# Patient Record
Sex: Male | Born: 1995 | Race: Black or African American | Hispanic: No | Marital: Single | Smoking: Current every day smoker
Health system: Southern US, Community
[De-identification: ages and names within clinical notes are randomized; demographics above are authoritative.]

## PROBLEM LIST (undated history)

## (undated) DIAGNOSIS — A539 Syphilis, unspecified: Secondary | ICD-10-CM

## (undated) HISTORY — PX: PATENT DUCTUS ARTERIOUS REPAIR: SHX269

---
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
## 2019-06-22 ENCOUNTER — Encounter (HOSPITAL_COMMUNITY): Payer: Self-pay | Admitting: Emergency Medicine

## 2019-06-22 ENCOUNTER — Other Ambulatory Visit: Payer: Self-pay

## 2019-06-22 DIAGNOSIS — F17210 Nicotine dependence, cigarettes, uncomplicated: Secondary | ICD-10-CM | POA: Insufficient documentation

## 2019-06-22 DIAGNOSIS — N341 Nonspecific urethritis: Secondary | ICD-10-CM | POA: Insufficient documentation

## 2019-06-22 DIAGNOSIS — Z7982 Long term (current) use of aspirin: Secondary | ICD-10-CM | POA: Insufficient documentation

## 2019-06-22 NOTE — ED Triage Notes (Signed)
Pt c/o swelling around head of penis and white discharge x 2 days.

## 2019-06-23 ENCOUNTER — Emergency Department (HOSPITAL_COMMUNITY)
Admission: EM | Admit: 2019-06-23 | Discharge: 2019-06-23 | Disposition: A | Discharge status: Home / Self Care | Payer: Self-pay | Attending: Emergency Medicine | Admitting: Emergency Medicine

## 2019-06-23 DIAGNOSIS — N342 Other urethritis: Secondary | ICD-10-CM

## 2019-06-23 LAB — URINALYSIS, ROUTINE W REFLEX MICROSCOPIC
Bacteria, UA: NONE SEEN
Bilirubin Urine: NEGATIVE
Glucose, UA: NEGATIVE mg/dL
Ketones, ur: 5 mg/dL — AB
Nitrite: NEGATIVE
Protein, ur: NEGATIVE mg/dL
Specific Gravity, Urine: 1.023 (ref 1.005–1.030)
WBC, UA: >50 WBC/hpf — ABNORMAL HIGH (ref 0–5)
pH: 6 (ref 5.0–8.0)

## 2019-06-23 LAB — RAPID HIV SCREEN (HIV 1/2 AB+AG)
HIV 1/2 Antibodies: NONREACTIVE
HIV-1 P24 Antigen - HIV24: NONREACTIVE

## 2019-06-23 MED ORDER — STERILE WATER FOR INJECTION IJ SOLN
INTRAMUSCULAR | Status: AC
  Administered 2019-06-23: 01:00:00 0.9 mL
  Filled 2019-06-23: qty 10

## 2019-06-23 MED ORDER — CEFTRIAXONE SODIUM 250 MG IJ SOLR
250.0000 mg | Freq: Once | INTRAMUSCULAR | Status: AC
  Administered 2019-06-23: 01:00:00 250 mg via INTRAMUSCULAR
  Filled 2019-06-23: qty 250

## 2019-06-23 MED ORDER — AZITHROMYCIN 250 MG PO TABS
1000.0000 mg | ORAL_TABLET | Freq: Once | ORAL | Status: AC
  Administered 2019-06-23: 1000 mg via ORAL
  Filled 2019-06-23: qty 4

## 2019-06-23 NOTE — ED Provider Notes (Signed)
[facility] EMERGENCY DEPARTMENT Provider Note   CSN: [id number] Arrival date & time: 06/22/19  2251     History   Chief Complaint Chief Complaint  Patient presents with  . Groin Swelling    HPI Timothy Harris is a 23 y.o. male.     Patient is a 23 year old male who presents to the emergency department with a complaint of swelling of the penis and discharge.  The patient states that this discharge started about 2 days ago.  He has had the swelling of the area behind the head of the penis for a little bit longer than that.  He says he has noticed a white discharge.  He has pain when he urinates.  There is been no blood involved.  Patient has not had any fever.  He has not noticed any unusual rash in his groin.  He has noticed some of the glands in his groin to be swollen.  He presents to the emergency department for evaluation of this issue.  The history is provided by the patient.    History reviewed. No pertinent past medical history.  There are no active problems to display for this patient.   Past Surgical History:  Procedure Laterality Date  . PATENT DUCTUS ARTERIOUS REPAIR          Home Medications    Prior to Admission medications   Medication Sig Start Date End Date Taking? Authorizing Provider  aspirin 81 MG chewable tablet Chew 81 mg by mouth every 8 (eight) hours as needed. For pain     [provider]  omeprazole (PRILOSEC) 20 MG capsule Take 1 po BID x 2 weeks then once a day 08/21/12   Rolland Porter, MD    Family History No family history on file.  Social History Social History   Tobacco Use  . Smoking status: Current Every Day Smoker  . Smokeless tobacco: Never Used  Substance Use Topics  . Alcohol use: Yes  . Drug use: Yes    Types: Marijuana     Allergies   Patient has no known allergies.   Review of Systems Review of Systems  Constitutional: Negative for activity change, appetite change and fever.  HENT: Negative for  congestion, ear discharge, ear pain, facial swelling, nosebleeds, rhinorrhea, sneezing and tinnitus.   Eyes: Negative for photophobia, pain and discharge.  Respiratory: Negative for cough, choking, shortness of breath and wheezing.   Cardiovascular: Negative for chest pain, palpitations and leg swelling.  Gastrointestinal: Negative for abdominal pain, blood in stool, constipation, diarrhea, nausea and vomiting.  Genitourinary: Positive for discharge and penile swelling. Negative for difficulty urinating, dysuria, flank pain, frequency, hematuria, scrotal swelling and testicular pain.  Musculoskeletal: Negative for back pain, gait problem, myalgias and neck pain.  Skin: Negative for color change, rash and wound.  Neurological: Negative for dizziness, seizures, syncope, facial asymmetry, speech difficulty, weakness and numbness.  Hematological: Negative for adenopathy. Does not bruise/bleed easily.  Psychiatric/Behavioral: Negative for agitation, confusion, hallucinations, self-injury and suicidal ideas. The patient is not nervous/anxious.      Physical Exam Updated Vital Signs BP (!) 148/39   Pulse 84   Temp 98.9 F (37.2 C)   Resp 17   Ht 5\' 7"  (1.702 m)   Wt 72.6 kg   SpO2 100%   BMI 25.06 kg/m   Physical Exam Vitals signs and nursing note reviewed.  Constitutional:      Appearance: He is well-developed. He is not toxic-appearing.  HENT:  Head: Normocephalic.     Right Ear: Tympanic membrane and external ear normal.     Left Ear: Tympanic membrane and external ear normal.  Eyes:     General: Lids are normal.     Pupils: Pupils are equal, round, and reactive to light.  Neck:     Musculoskeletal: Normal range of motion and neck supple.     Vascular: No carotid bruit.  Cardiovascular:     Rate and Rhythm: Normal rate and regular rhythm.     Pulses: Normal pulses.     Heart sounds: Normal heart sounds.  Pulmonary:     Effort: No respiratory distress.     Breath sounds:  Normal breath sounds.  Abdominal:     General: Bowel sounds are normal.     Palpations: Abdomen is soft.     Tenderness: There is no abdominal tenderness. There is no guarding.  Genitourinary:    Comments: There are palpable nodes in the right and left inguinal area.  The patient is circumcised.  There is a white to yellowish discharge from the urethra.  There is minimal swelling of the glans of the penis.  There is swelling just behind the glans of the penis.  This area is nontender, and soft.  There is no rash on the glans of the penis.  There is no testicular tenderness or swelling.  There is no evidence for hernia. Musculoskeletal: Normal range of motion.  Lymphadenopathy:     Head:     Right side of head: No submandibular adenopathy.     Left side of head: No submandibular adenopathy.     Cervical: No cervical adenopathy.  Skin:    General: Skin is warm and dry.  Neurological:     Mental Status: He is alert and oriented to person, place, and time.     Cranial Nerves: No cranial nerve deficit.     Sensory: No sensory deficit.  Psychiatric:        Speech: Speech normal.      ED Treatments / Results  Labs (all labs ordered are listed, but only abnormal results are displayed) Labs Reviewed - No data to display  EKG None  Radiology No results found.  Procedures Procedures (including critical care time)  Medications Ordered in ED Medications - No data to display   Initial Impression / Assessment and Plan / ED Course  I have reviewed the triage vital signs and the nursing notes.  Pertinent labs & imaging results that were available during my care of the patient were reviewed by me and considered in my medical decision making (see chart for details).          Final Clinical Impressions(s) / ED Diagnoses MDM  Vital signs are within normal limits.  Pulse oximetry is 100% on room air.  Patient has a urethral discharge.  He also has some swelling behind the head of  the penis.  He is not having difficulty with urinating with the exception of pain with urinating.  He has minimal swelling of the glands of the penis.  There is no discoloration of the head of the penis.  There is a white to yellowish urethral discharge present.  Patient treated with Rocephin and Zithromax.  GC chlamydia testing is pending.  RPR and HIV also pending. Patient seen with me by Dr. Clayborne Dana  I have asked the patient to apply gentle pressure to the swollen area behind the head of the penis to help hopefully decrease some of  the swelling.  I have asked him to see the urology specialist if this is not improving, or if he has difficulty passing his urine, or change in the coloration of the glans of the penis.  Patient is in agreement with this plan.   Final diagnoses:  Urethritis    ED Discharge Orders    None       Timothy Harris, Timothy Mitchell, PA-C 06/23/19 0139    Mesner, Barbara CowerJason, MD 06/23/19 [phone]

## 2019-06-23 NOTE — ED Provider Notes (Signed)
Medical screening examination/treatment/procedure(s) were conducted as a shared visit with non-physician practitioner(s) and myself.  I personally evaluated the patient during the encounter.  Here with swelling just proximal to glans penis. Soft, not restricting on exam. Glans not edematous or discolored. Has some dysuria, will eval for STD/UTI. Otherwise gentle/warm compresses, urology follow up if needed.         Vanessa Kampf, Corene Cornea, MD 06/23/19 [phone]

## 2019-06-23 NOTE — Discharge Instructions (Addendum)
Your examination reveals a urethritis.  You also have some swelling of the area behind the head of the penis.  You were treated in the emergency department with an intramuscular shot of Rocephin, and oral Zithromax.  Please refrain from all sexual activity over the next 7 days.  Please apply gentle pressure to the swelling in the head of the penis.  Please see Dr. Cherylann Parr if you should develop difficulty with passing your urine, or if there is discoloration of the head of your penis.  Return to the emergency department if there are any emergent changes in your condition, worsening of your symptoms, problems or concerns.

## 2019-06-24 LAB — RPR
RPR Ser Ql: REACTIVE — AB
RPR Titer: 1:32 {titer}

## 2019-06-24 LAB — URINE CULTURE: Culture: <10000 — AB

## 2019-06-25 LAB — T.PALLIDUM AB, TOTAL: T Pallidum Abs: REACTIVE — AB

## 2020-06-06 ENCOUNTER — Encounter (HOSPITAL_COMMUNITY): Payer: Self-pay | Admitting: Emergency Medicine

## 2020-06-06 ENCOUNTER — Other Ambulatory Visit: Payer: Self-pay

## 2020-06-06 ENCOUNTER — Emergency Department (HOSPITAL_COMMUNITY)
Admission: EM | Admit: 2020-06-06 | Discharge: 2020-06-06 | Disposition: A | Discharge status: Home / Self Care | Payer: Self-pay | Attending: Emergency Medicine | Admitting: Emergency Medicine

## 2020-06-06 DIAGNOSIS — N341 Nonspecific urethritis: Secondary | ICD-10-CM | POA: Insufficient documentation

## 2020-06-06 DIAGNOSIS — Z7982 Long term (current) use of aspirin: Secondary | ICD-10-CM | POA: Insufficient documentation

## 2020-06-06 DIAGNOSIS — N342 Other urethritis: Secondary | ICD-10-CM

## 2020-06-06 DIAGNOSIS — F17200 Nicotine dependence, unspecified, uncomplicated: Secondary | ICD-10-CM | POA: Insufficient documentation

## 2020-06-06 HISTORY — DX: Syphilis, unspecified: A53.9

## 2020-06-06 LAB — URINALYSIS, ROUTINE W REFLEX MICROSCOPIC
Bilirubin Urine: NEGATIVE
Glucose, UA: NEGATIVE mg/dL
Ketones, ur: NEGATIVE mg/dL
Nitrite: NEGATIVE
Protein, ur: 30 mg/dL — AB
Specific Gravity, Urine: 1.028 (ref 1.005–1.030)
pH: 6 (ref 5.0–8.0)

## 2020-06-06 MED ORDER — DOXYCYCLINE HYCLATE 100 MG PO TABS
100.0000 mg | ORAL_TABLET | Freq: Once | ORAL | Status: AC
  Administered 2020-06-06: 100 mg via ORAL
  Filled 2020-06-06: qty 1

## 2020-06-06 MED ORDER — CEFTRIAXONE SODIUM 500 MG IJ SOLR
500.0000 mg | Freq: Once | INTRAMUSCULAR | Status: AC
  Administered 2020-06-06: 500 mg via INTRAMUSCULAR
  Filled 2020-06-06: qty 500

## 2020-06-06 MED ORDER — DOXYCYCLINE HYCLATE 100 MG PO CAPS: 100.0000 mg | ORAL_CAPSULE | Freq: Two times a day (BID) | ORAL | 0 refills | Status: AC

## 2020-06-06 NOTE — ED Notes (Signed)
Report to Ginger, RN, [facility]

## 2020-06-06 NOTE — ED Triage Notes (Signed)
Pt here for c/o penile discharge that he noticed yesterday and has "gotten heavier today". Pt hx Syphilis.

## 2020-06-06 NOTE — ED Notes (Addendum)
Pt here for complaint of penile discharge   Worse today   Wears condoms "sometimes"  Multiple partners   Has appt with health department on [facility]  Also reports burning w urination

## 2020-06-06 NOTE — ED Provider Notes (Signed)
[facility] EMERGENCY DEPARTMENT Provider Note   CSN: [id number] Arrival date & time: 06/06/20  0057     History Chief Complaint  Patient presents with  . SEXUALLY TRANSMITTED DISEASE    Timothy Harris is a 24 y.o. male.  The history is provided by the patient.  Penile Discharge This is a new problem. The current episode started 12 to 24 hours ago. The problem occurs daily. The problem has been gradually worsening. Pertinent negatives include no abdominal pain. Exacerbated by: urination. Nothing relieves the symptoms.  pt reports penile discharge No penile lesions He does not always use condoms Previous h/o syphilis     Past Medical History:  Diagnosis Date  . Syphilis     There are no problems to display for this patient.   Past Surgical History:  Procedure Laterality Date  . PATENT DUCTUS ARTERIOUS REPAIR         History reviewed. No pertinent family history.  Social History   Tobacco Use  . Smoking status: Current Every Day Smoker  . Smokeless tobacco: Never Used  Vaping Use  . Vaping Use: Never used  Substance Use Topics  . Alcohol use: Yes  . Drug use: Yes    Types: Marijuana    Home Medications Prior to Admission medications   Medication Sig Start Date End Date Taking? Authorizing Provider  aspirin 81 MG chewable tablet Chew 81 mg by mouth every 8 (eight) hours as needed. For pain     [provider]  doxycycline (VIBRAMYCIN) 100 MG capsule Take 1 capsule (100 mg total) by mouth 2 (two) times daily. One po bid x 7 days 06/06/20   Zadie Rhine, MD  omeprazole (PRILOSEC) 20 MG capsule Take 1 po BID x 2 weeks then once a day 08/21/12   Devoria Albe, MD    Allergies    Patient has no known allergies.  Review of Systems   Review of Systems  Constitutional: Negative for fever.  Gastrointestinal: Negative for abdominal pain.  Genitourinary: Positive for discharge.    Physical Exam Updated Vital Signs BP (!) 147/68 (BP Location: Right  Arm)   Pulse 76   Temp 98.4 F (36.9 C) (Oral)   Resp 18   Ht 1.702 m (5\' 7" )   Wt 81.6 kg   SpO2 100%   BMI 28.19 kg/m   Physical Exam CONSTITUTIONAL: Well developed/well nourished HEAD: Normocephalic/atraumatic EYES: EOMI CV: S1/S2 noted, no murmurs/rubs/gallops noted LUNGS:  no apparent distress ABDOMEN: soft GU whitish penile discharge, no penile lesions, no scrotal tenderness/edema, nurse chaperone present NEURO: Pt is awake/alert/appropriate, moves all extremitiesx4.  No facial droop.   EXTREMITIES:  full ROM SKIN: warm, color normal PSYCH: no abnormalities of mood noted, alert and oriented to situation  ED Results / Procedures / Treatments   Labs (all labs ordered are listed, but only abnormal results are displayed) Labs Reviewed  URINALYSIS, ROUTINE W REFLEX MICROSCOPIC - Abnormal; Notable for the following components:      Result Value   APPearance CLOUDY (*)    Hgb urine dipstick SMALL (*)    Protein, ur 30 (*)    Leukocytes,Ua LARGE (*)    Bacteria, UA RARE (*)    All other components within normal limits  GC/CHLAMYDIA PROBE AMP ([place]) NOT AT [facility]    EKG None  Radiology No results found.  Procedures Procedures (including critical care time)  Medications Ordered in ED Medications  cefTRIAXone (ROCEPHIN) injection 500 mg (has no administration in time range)  doxycycline (VIBRA-TABS) tablet 100 mg (has no administration in time range)    ED Course  I have reviewed the triage vital signs and the nursing notes.  Pertinent labs  results that were available during my care of the patient were reviewed by me and considered in my medical decision making (see chart for details).    MDM Rules/Calculators/A&P                          Pt declines HIV/RPR testing, reports he has appt later this week for syphilis treatment  Final Clinical Impression(s) / ED Diagnoses Final diagnoses:  Urethritis    Rx / DC Orders ED Discharge Orders          Ordered    doxycycline (VIBRAMYCIN) 100 MG capsule  2 times daily        06/06/20 0249           Zadie Rhine, MD 06/06/20 [phone]

## 2020-06-06 NOTE — ED Notes (Signed)
Pt does not want labs drawn. States he has an appt at health dept this week to have labs drawn.

## 2020-06-06 NOTE — ED Notes (Signed)
Urine to lab

## 2020-06-07 LAB — GC/CHLAMYDIA PROBE AMP (~~LOC~~) NOT AT ARMC
Chlamydia: NEGATIVE
Comment: NEGATIVE
Comment: NORMAL
Neisseria Gonorrhea: POSITIVE — AB

## 2021-11-14 ENCOUNTER — Emergency Department (HOSPITAL_COMMUNITY): Payer: Self-pay

## 2021-11-14 ENCOUNTER — Encounter (HOSPITAL_COMMUNITY): Payer: Self-pay | Admitting: *Deleted

## 2021-11-14 ENCOUNTER — Emergency Department (HOSPITAL_COMMUNITY)
Admission: EM | Admit: 2021-11-14 | Discharge: 2021-11-14 | Disposition: A | Discharge status: Home / Self Care | Payer: Self-pay | Attending: Emergency Medicine | Admitting: Emergency Medicine

## 2021-11-14 ENCOUNTER — Other Ambulatory Visit: Payer: Self-pay

## 2021-11-14 DIAGNOSIS — Z20822 Contact with and (suspected) exposure to covid-19: Secondary | ICD-10-CM | POA: Insufficient documentation

## 2021-11-14 DIAGNOSIS — J069 Acute upper respiratory infection, unspecified: Secondary | ICD-10-CM | POA: Insufficient documentation

## 2021-11-14 LAB — RESP PANEL BY RT-PCR (FLU A&B, COVID) ARPGX2
Influenza A by PCR: NEGATIVE
Influenza B by PCR: NEGATIVE
SARS Coronavirus 2 by RT PCR: NEGATIVE

## 2021-11-14 MED ORDER — ALBUTEROL SULFATE HFA 108 (90 BASE) MCG/ACT IN AERS
2.0000 | INHALATION_SPRAY | Freq: Once | RESPIRATORY_TRACT | Status: AC
  Administered 2021-11-14: 2 via RESPIRATORY_TRACT
  Filled 2021-11-14: qty 6.7

## 2021-11-14 NOTE — ED Provider Notes (Signed)
?[facility] ?Provider Note ? ? ?CSN: [id number] ?Arrival date & time: 11/14/21  1434 ? ?  ? ?History ? ?Chief Complaint  ?Patient presents with  ? Cough  ? ? ?Timothy Harris is a 26 y.o. male. ? ?HPI ? ? Patient is a 26 year-old-male with a pertinent history of PDA repair in 2011 and current 2 pack year smoking history who presents to the ED with complaints of cough. Cough started about 2 days ago and has worsened since onset. Cough is dry, non-productive with associated sharp pleuritic chest pain at mid-chest. States he is concerned of his PDA device with the chest pain. Patient endorses one episode of post-tussive emesis with some blood yesterday. Has tried OTC cough remedies such as cough drops and honey with minimal relief. Had a "seasonal cold" about 1 week ago. Denies any recent sick contacts.  Patient Dors that he does have slight shortness of breath but this is only intermittent, no happens after he coughs and tries to ambulate.  He currently has no shortness of breath at this time, he states chest pain is only after coughing, has not this time.  No history of PEs or DVTs currently not on hormone therapy. ? ? ?Home Medications ?Prior to Admission medications   ?Medication Sig Start Date End Date Taking? Authorizing Provider  ?aspirin 81 MG chewable tablet Chew 81 mg by mouth every 8 (eight) hours as needed. For pain     [provider]  ?doxycycline (VIBRAMYCIN) 100 MG capsule Take 1 capsule (100 mg total) by mouth 2 (two) times daily. One po bid x 7 days 06/06/20   Zadie Rhine, MD  ?omeprazole (PRILOSEC) 20 MG capsule Take 1 po BID x 2 weeks then once a day 08/21/12   Devoria Albe, MD  ?   ? ?Allergies    ?Patient has no known allergies.   ? ?Review of Systems   ?Review of Systems  ?Constitutional:  Negative for chills and fever.  ?Respiratory:  Positive for cough and chest tightness. Negative for shortness of breath.   ?Cardiovascular:  Negative for chest pain.   ?Gastrointestinal:  Negative for abdominal pain, diarrhea, nausea and vomiting.  ?Neurological:  Negative for headaches.  ? ?Physical Exam ?Updated Vital Signs ?BP (!) 150/98 (BP Location: Right Arm)   Pulse 92   Temp 98.4 ?F (36.9 ?C) (Oral)   Resp 18   Ht 5\' 7"  (1.702 m)   Wt 93.9 kg   SpO2 99%   BMI 32.42 kg/m?  ?Physical Exam ?Vitals and nursing note reviewed.  ?Constitutional:   ?   General: He is not in acute distress. ?   Appearance: He is not ill-appearing.  ?HENT:  ?   Head: Normocephalic and atraumatic.  ?   Right Ear: Tympanic membrane, ear canal and external ear normal.  ?   Left Ear: Tympanic membrane, ear canal and external ear normal.  ?   Nose: No congestion.  ?   Mouth/Throat:  ?   Mouth: Mucous membranes are moist.  ?   Pharynx: Oropharynx is clear. No oropharyngeal exudate or posterior oropharyngeal erythema.  ?Eyes:  ?   Conjunctiva/sclera: Conjunctivae normal.  ?Cardiovascular:  ?   Rate and Rhythm: Normal rate and regular rhythm.  ?   Pulses: Normal pulses.  ?   Heart sounds: No murmur heard. ?  No friction rub. No gallop.  ?Pulmonary:  ?   Effort: No respiratory distress.  ?   Breath sounds: No wheezing, rhonchi  or rales.  ?   Comments: Patient is showing no signs of respiratory distress, he is nontachypneic nonhypoxic, he speaking full sentences, lung sounds were assessed he has slightly diminished lung sounds in the lower lobes bilaterally, there is no wheezing rales or rhonchi present. ?Abdominal:  ?   Palpations: Abdomen is soft.  ?   Tenderness: There is no abdominal tenderness. There is no right CVA tenderness or left CVA tenderness.  ?Musculoskeletal:  ?   Right lower leg: No edema.  ?   Left lower leg: No edema.  ?Skin: ?   General: Skin is warm and dry.  ?Neurological:  ?   Mental Status: He is alert.  ?Psychiatric:     ?   Mood and Affect: Mood normal.  ? ? ?ED Results / Procedures / Treatments   ?Labs ?(all labs ordered are listed, but only abnormal results are  displayed) ?Labs Reviewed  ?RESP PANEL BY RT-PCR (FLU A&B, COVID) [id number]  ? ? ?EKG ?EKG Interpretation ? ?Date/Time:  Tuesday November 14 2021 15:30:50 EDT ?Ventricular Rate:  77 ?PR Interval:  156 ?QRS Duration: 108 ?QT Interval:  386 ?QTC Calculation: 436 ?R Axis:   83 ?Text Interpretation: Normal sinus rhythm with sinus arrhythmia Normal ECG When compared with ECG of 21-Aug-2012 19:03, PREVIOUS ECG IS PRESENT Confirmed by Kommor, Madison ([id number]) on 11/14/2021 4:11:20 PM ? ?Radiology ?DG Chest 2 View ? ?Result Date: 11/14/2021 ?CLINICAL DATA:  A 26 year old male presents for evaluation of dry cough for 2 days. EXAM: CHEST - 2 VIEW COMPARISON:  August 21, 2012. FINDINGS: Trachea midline. Cardiomediastinal contours and hilar structures are normal. Lungs are clear. No sign of effusion. Added density projecting over the AP window likely related to prior surgical repair of patent ductus. On limited assessment there is no acute skeletal finding. IMPRESSION: No active cardiopulmonary disease. Stable appearance of added density in the mediastinum adjacent to the thoracic aorta perhaps related to prior patent ductus repair. Electronically Signed   By: Donzetta Kohut M.D.   On: 11/14/2021 15:34   ? ?Procedures ?Procedures  ? ? ?Medications Ordered in ED ?Medications  ?albuterol (VENTOLIN HFA) 108 (90 Base) MCG/ACT inhaler 2 puff (2 puffs Inhalation Given 11/14/21 1537)  ? ? ?ED Course/ Medical Decision Making/ A&P ?  ?                        ?Medical Decision Making ?Amount and/or Complexity of Data Reviewed ?Radiology: ordered. ? ?Risk ?Prescription drug management. ? ? ?This patient presents to the ED for concern of URI-like symptoms, this involves an extensive number of treatment options, and is a complaint that carries with it a high risk of complications and morbidity.  The differential diagnosis includes ACS, PE, pneumonia ? ? ? ?Additional history obtained: ? ?Additional history obtained from N/A ?External records from  outside source obtained and reviewed including N/A ? ? ?Co morbidities that complicate the patient evaluation ? ?PDA ? ?Social Determinants of Health: ? ?N/A ? ? ? ?Lab Tests: ? ?I Ordered, and personally interpreted labs.  The pertinent results include: Respiratory panel ? ? ?Imaging Studies ordered: ? ?I ordered imaging studies including chest x-ray ?I independently visualized and interpreted imaging which showed chest x-ray unremarkable ?I agree with the radiologist interpretation ? ? ?Cardiac Monitoring: ? ?The patient was maintained on a cardiac monitor.  I personally viewed and interpreted the cardiac monitored which showed an underlying rhythm of: EKG without signs of ischemia ? ? ?  Medicines ordered and prescription drug management: ? ?I ordered medication including albuterol for chest tightness ?I have reviewed the patients home medicines and have made adjustments as needed ? ?Critical Interventions: ? ?N/A ? ? ?Reevaluation: ? ?Presented with URI-like symptoms, exam consistent with likely viral infection, he had slightly diminished lung sounds during my exam will provide with bronchodilators and reassess. ? ? ?Consultations Obtained: ? ?N/A ? ? ?Test Considered: ? ?N/A ? ? ? ?Rule out ?Low suspicion for systemic infection as patient is nontoxic-appearing, vital signs reassuring, no obvious source infection noted on exam.  Low suspicion for pneumonia as lung sounds are clear bilaterally, x-ray did not reveal any acute findings.  I have low suspicion for PE as patient denies pleuritic chest pain, shortness of breath, patient is PERC.  Low suspicion for ACS as presentation atypical, chest pain is only after coughing, is reproducible, EKG without signs of ischemia more consistent with likely viral infection.  Low suspicion for strep throat as oropharynx was visualized, no erythema or exudates noted.  Low suspicion patient would need  hospitalized due to viral infection or Covid as vital signs reassuring,  patient is not in respiratory distress.  ? ? ? ? ?Dispostion and problem list ? ?After consideration of the diagnostic results and the patients response to treatment, I feel that the patent would benefit from discharge. ? ?URI-likely viral in natur

## 2021-11-14 NOTE — Discharge Instructions (Addendum)
Likely a viral infection, recommend over-the-counter pain medications like ibuprofen Tylenol for fever and pain control, nasal decongestions like Flonase and Zyrtec, Mucinex for cough.  If not eating recommend supplementing with Gatorade to help with electrolyte supplementation. ? ?Given you albuterol please use as needed for shortness of breath use 1 to 2 puffs every 4-6 hours. ? ?Follow-up PCP for further evaluation. ? ?Come back to the emergency department if you develop chest pain, shortness of breath, severe abdominal pain, uncontrolled nausea, vomiting, diarrhea. ? ?

## 2021-11-14 NOTE — ED Triage Notes (Signed)
Pt with cough dry mostly per pt x 2 days. Pt with hx of Patent ductus arterious repair in 2011.  ?

## 2021-11-20 ENCOUNTER — Encounter (HOSPITAL_COMMUNITY): Payer: Self-pay | Admitting: *Deleted

## 2021-11-20 ENCOUNTER — Emergency Department (HOSPITAL_COMMUNITY)
Admission: EM | Admit: 2021-11-20 | Discharge: 2021-11-20 | Disposition: A | Discharge status: Home / Self Care | Payer: Self-pay | Attending: Emergency Medicine | Admitting: Emergency Medicine

## 2021-11-20 DIAGNOSIS — Z7982 Long term (current) use of aspirin: Secondary | ICD-10-CM | POA: Insufficient documentation

## 2021-11-20 DIAGNOSIS — R300 Dysuria: Secondary | ICD-10-CM | POA: Insufficient documentation

## 2021-11-20 DIAGNOSIS — R350 Frequency of micturition: Secondary | ICD-10-CM | POA: Insufficient documentation

## 2021-11-20 LAB — URINALYSIS, ROUTINE W REFLEX MICROSCOPIC
Bacteria, UA: NONE SEEN
Bilirubin Urine: NEGATIVE
Glucose, UA: NEGATIVE mg/dL
Ketones, ur: NEGATIVE mg/dL
Leukocytes,Ua: NEGATIVE
Nitrite: NEGATIVE
Protein, ur: NEGATIVE mg/dL
Specific Gravity, Urine: 1.001 — ABNORMAL LOW (ref 1.005–1.030)
pH: 7 (ref 5.0–8.0)

## 2021-11-20 LAB — CBG MONITORING, ED: Glucose-Capillary: 125 mg/dL — ABNORMAL HIGH (ref 70–99)

## 2021-11-20 LAB — PREGNANCY, URINE: Preg Test, Ur: NEGATIVE

## 2021-11-20 MED ORDER — CEPHALEXIN 500 MG PO CAPS: 500.0000 mg | ORAL_CAPSULE | Freq: Four times a day (QID) | ORAL | 0 refills | Status: AC

## 2021-11-20 NOTE — ED Provider Notes (Signed)
?[facility] ?Provider Note ? ? ?CSN: [id number] ?Arrival date & time: 11/20/21  1516 ? ?  ? ?History ? ?Chief Complaint  ?Patient presents with  ? Urinary Frequency  ? ? ?Timothy Harris is a 26 y.o. male. ? ? ?Urinary Frequency ? ? ?Patient presents with urinary frequency 1 day.  There is some dysuria but no hematuria.  Denies any penile discharge or abdominal pain or flank pain.  No fevers at home.  No new sexual partners.  States yesterday he did a "cleanse" where he drank a large amounts of fluids and has been peeing all throughout the night.  Unsure if that might be contributing to his current presentation. ? ?Home Medications ?Prior to Admission medications   ?Medication Sig Start Date End Date Taking? Authorizing Provider  ?aspirin 81 MG chewable tablet Chew 81 mg by mouth every 8 (eight) hours as needed. For pain     [provider]  ?doxycycline (VIBRAMYCIN) 100 MG capsule Take 1 capsule (100 mg total) by mouth 2 (two) times daily. One po bid x 7 days 06/06/20   Zadie Rhine, MD  ?omeprazole (PRILOSEC) 20 MG capsule Take 1 po BID x 2 weeks then once a day 08/21/12   Devoria Albe, MD  ?   ? ?Allergies    ?Patient has no known allergies.   ? ?Review of Systems   ?Review of Systems  ?Genitourinary:  Positive for frequency.  ? ?Physical Exam ?Updated Vital Signs ?BP (!) 148/108 (BP Location: Right Arm)   Pulse 75   Temp 98.3 ?F (36.8 ?C) (Oral)   Resp 18   Ht 5\' 7"  (1.702 m)   Wt 91.6 kg   SpO2 100%   BMI 31.64 kg/m?  ?Physical Exam ?Vitals and nursing note reviewed. Exam conducted with a chaperone present.  ?Constitutional:   ?   Appearance: Normal appearance.  ?HENT:  ?   Head: Normocephalic and atraumatic.  ?Eyes:  ?   General: No scleral icterus.    ?   Right eye: No discharge.     ?   Left eye: No discharge.  ?   Extraocular Movements: Extraocular movements intact.  ?   Pupils: Pupils are equal, round, and reactive to light.  ?Cardiovascular:  ?   Rate and Rhythm: Normal  rate and regular rhythm.  ?   Pulses: Normal pulses.  ?   Heart sounds: Normal heart sounds. No murmur heard. ?  No friction rub. No gallop.  ?Pulmonary:  ?   Effort: Pulmonary effort is normal. No respiratory distress.  ?   Breath sounds: Normal breath sounds.  ?Abdominal:  ?   General: Abdomen is flat. Bowel sounds are normal. There is no distension.  ?   Palpations: Abdomen is soft.  ?   Tenderness: There is no abdominal tenderness.  ?Skin: ?   General: Skin is warm and dry.  ?   Coloration: Skin is not jaundiced.  ?Neurological:  ?   Mental Status: He is alert. Mental status is at baseline.  ?   Coordination: Coordination normal.  ? ? ?ED Results / Procedures / Treatments   ?Labs ?(all labs ordered are listed, but only abnormal results are displayed) ?Labs Reviewed  ?URINALYSIS, ROUTINE W REFLEX MICROSCOPIC - Abnormal; Notable for the following components:  ?    Result Value  ? Specific Gravity, Urine 1.001 (*)   ? Hgb urine dipstick SMALL (*)   ? All other components within normal limits  ?CBG MONITORING,  ED - Abnormal; Notable for the following components:  ? Glucose-Capillary 125 (*)   ? All other components within normal limits  ?URINE CULTURE  ?PREGNANCY, URINE  ?GC/CHLAMYDIA PROBE AMP ([place]) NOT AT [facility]  ? ? ?EKG ?None ? ?Radiology ?No results found. ? ?Procedures ?Procedures  ? ? ?Medications Ordered in ED ?Medications - No data to display ? ?ED Course/ Medical Decision Making/ A&P ?  ?                        ?Medical Decision Making ?Amount and/or Complexity of Data Reviewed ?Labs: ordered. ? ? ?This patient presents to the ED for concern of dysuria this involves an extensive number of treatment options, and is a complaint that carries with it a high risk of complications and morbidity.  The differential diagnosis includes but is not limited to undiagnosed diabetes, UTI, urethritis, STD ? ? ?Additional history obtained:  ? ?Reviewed record, patient has history of frequent visits here with  urethritis, historically has penile discharge and that is not the case today. ?  ?Lab Tests: ? ?I ordered, viewed, and personally interpreted labs.  The pertinent results include:   ?-Erroneously ordered pregnancy urine. ?- Urine cultures pending.  GC chlamydia pending. ?-UA with trace hematuria but no leukocyturia ?-CBG slight elevation of sugar at 125 but not significantly hyperglycemic. ? ? ?Problems addressed / ED Course: ?26 year old with increased urinary frequency after drinking plenty a large amount of fluids yesterday.  UA shows some trace hematuria but there is no obvious signs of infection.  Culture is pending.   ? ?He is not having any symptoms that are suggestive of an STD, possible he has a UTI that was missed in UA.  I think more likely he is over hydrating and that is causing him to need to pee more often.  Patient agreeable to wait and see 3 days for the urine culture.  He will check on MyChart and if he does have a UTI he will pick up the antibiotic. ?  ?Social Determinants of Health: ? ?  ?Disposition: ? ? ?After consideration of the diagnostic results and the patients response to treatment, I feel that the patent would benefit from D/C. ? ? ? ? ? ? ? ? ?Final Clinical Impression(s) / ED Diagnoses ?Final diagnoses:  ?None  ? ? ?Rx / DC Orders ?ED Discharge Orders   ? ? None  ? ?  ? ? ?  ?Theron Arista, PA-C ?11/20/21 1756 ? ?  ?Mancel Bale, MD ?11/21/21 0015 ? ?

## 2021-11-20 NOTE — ED Triage Notes (Signed)
Urinary frequency

## 2021-11-20 NOTE — Discharge Instructions (Addendum)
I suspect the increased urination is probably from drinking too much fluid.  Check MyChart for the results of the culture.  If you grow any bacteria that would suggest a UTI you can pick up the Keflex on Friday next week.  Return for new or worsening symptoms. ?

## 2021-11-21 LAB — GC/CHLAMYDIA PROBE AMP (~~LOC~~) NOT AT ARMC
Chlamydia: NEGATIVE
Comment: NEGATIVE
Comment: NORMAL
Neisseria Gonorrhea: NEGATIVE

## 2021-11-22 LAB — URINE CULTURE: Culture: <10000 — AB

## 2022-01-23 ENCOUNTER — Other Ambulatory Visit: Payer: Self-pay

## 2022-01-23 ENCOUNTER — Emergency Department (HOSPITAL_COMMUNITY): Payer: Self-pay

## 2022-01-23 ENCOUNTER — Encounter (HOSPITAL_COMMUNITY): Payer: Self-pay | Admitting: Emergency Medicine

## 2022-01-23 ENCOUNTER — Emergency Department (HOSPITAL_COMMUNITY)
Admission: EM | Admit: 2022-01-23 | Discharge: 2022-01-23 | Disposition: A | Discharge status: Home / Self Care | Payer: Self-pay | Attending: Emergency Medicine | Admitting: Emergency Medicine

## 2022-01-23 DIAGNOSIS — Z7982 Long term (current) use of aspirin: Secondary | ICD-10-CM | POA: Insufficient documentation

## 2022-01-23 DIAGNOSIS — J9801 Acute bronchospasm: Secondary | ICD-10-CM

## 2022-01-23 DIAGNOSIS — J45909 Unspecified asthma, uncomplicated: Secondary | ICD-10-CM | POA: Insufficient documentation

## 2022-01-23 MED ORDER — ALBUTEROL SULFATE HFA 108 (90 BASE) MCG/ACT IN AERS
2.0000 | INHALATION_SPRAY | Freq: Once | RESPIRATORY_TRACT | Status: AC
  Administered 2022-01-23: 2 via RESPIRATORY_TRACT
  Filled 2022-01-23: qty 6.7

## 2022-01-23 MED ORDER — PREDNISONE 20 MG PO TABS
40.0000 mg | ORAL_TABLET | Freq: Once | ORAL | Status: AC
  Administered 2022-01-23: 40 mg via ORAL
  Filled 2022-01-23: qty 2

## 2022-01-23 MED ORDER — PREDNISONE 20 MG PO TABS: 40.0000 mg | ORAL_TABLET | Freq: Every day | ORAL | 0 refills | Status: AC

## 2022-01-23 MED ORDER — ALBUTEROL SULFATE HFA 108 (90 BASE) MCG/ACT IN AERS
1.0000 | INHALATION_SPRAY | Freq: Four times a day (QID) | RESPIRATORY_TRACT | 0 refills | Status: AC | PRN
Start: 2022-01-23 — End: ?

## 2022-01-23 NOTE — ED Notes (Signed)
Pt ambulated down hallway to end and back to room approximately 200 ft without difficulty or SOB.  RA sats 98-99% maintained during entire ambulation.

## 2022-01-23 NOTE — ED Triage Notes (Signed)
Pt having URI symptoms with SOB since yesterday, out of inhaler.

## 2022-01-23 NOTE — Discharge Instructions (Signed)
Use 1 to 2 puffs of the albuterol inhaler every 4-6 hours as needed.  Take the prednisone starting tomorrow as directed until finished.  Follow-up with your primary care provider for recheck return to the emergency department for any new or worsening symptoms

## 2022-01-23 NOTE — ED Provider Notes (Signed)
[facility] Provider Note   CSN: [id number] Arrival date & time: 01/23/22  1525     History  Chief Complaint  Patient presents with   URI    Timothy Harris is a 26 y.o. male.   URI Presenting symptoms: cough   Presenting symptoms: no congestion, no fever and no sore throat   Associated symptoms: wheezing   Associated symptoms: no arthralgias, no headaches and no myalgias       Timothy Harris is a 26 y.o. male with reported history of asthma, who presents to the Emergency Department complaining of cough, chest congestion and shortness of breath.  Symptoms present x 1-2 days.  Ran out of his inhaler 2 days ago.  Symptoms worse with exertion.  Cough reported as occasional and he has recently began exercising which he believes has exacerbated his asthma.  He denies fever, chills, chest pain, sore throat.  No hx of PE/DVT.    Home Medications Prior to Admission medications   Medication Sig Start Date End Date Taking? Authorizing Provider  aspirin 81 MG chewable tablet Chew 81 mg by mouth every 8 (eight) hours as needed. For pain     [provider]  cephALEXin (KEFLEX) 500 MG capsule Take 1 capsule (500 mg total) by mouth 4 (four) times daily. 11/24/21   Sherrill Raring, PA-C  doxycycline (VIBRAMYCIN) 100 MG capsule Take 1 capsule (100 mg total) by mouth 2 (two) times daily. One po bid x 7 days 06/06/20   Ripley Fraise, MD  omeprazole (PRILOSEC) 20 MG capsule Take 1 po BID x 2 weeks then once a day 08/21/12   Rolland Porter, MD      Allergies    Patient has no known allergies.    Review of Systems   Review of Systems  Constitutional:  Negative for chills and fever.  HENT:  Negative for congestion, sore throat and trouble swallowing.   Respiratory:  Positive for cough, choking, shortness of breath and wheezing. Negative for chest tightness.   Cardiovascular:  Negative for chest pain and leg swelling.  Gastrointestinal:  Negative for abdominal pain, nausea and  vomiting.  Musculoskeletal:  Negative for arthralgias and myalgias.  Skin:  Negative for rash and wound.  Neurological:  Negative for dizziness, syncope, weakness, numbness and headaches.   Physical Exam Updated Vital Signs BP (!) 148/91 (BP Location: Right Arm)   Pulse 73   Temp 98.7 F (37.1 C) (Oral)   Resp 19   Ht 5\' 7"  (1.702 m)   Wt 91.6 kg   SpO2 100%   BMI 31.63 kg/m  Physical Exam Vitals and nursing note reviewed.  Constitutional:      Appearance: Normal appearance. He is not ill-appearing or toxic-appearing.  HENT:     Mouth/Throat:     Mouth: Mucous membranes are moist.     Pharynx: Oropharynx is clear.  Cardiovascular:     Rate and Rhythm: Normal rate and regular rhythm.     Pulses: Normal pulses.  Pulmonary:     Effort: Pulmonary effort is normal.     Comments: Slightly diminished lung sounds bilaterally.  Expiratory wheeze heard at right lung base.  No increased work of breathing on exam Musculoskeletal:        General: Normal range of motion.  Skin:    General: Skin is warm.     Capillary Refill: Capillary refill takes less than 2 seconds.  Neurological:     General: No focal deficit present.  Mental Status: He is alert.     Sensory: No sensory deficit.     Motor: No weakness.    ED Results / Procedures / Treatments   Labs (all labs ordered are listed, but only abnormal results are displayed) Labs Reviewed - No data to display  EKG None  Radiology DG Chest 2 View  Result Date: 01/23/2022 CLINICAL DATA:  SOB EXAM: CHEST - 2 VIEW COMPARISON:  Chest radiographs November 14, 2021. FINDINGS: Similar cardiomediastinal silhouette. Similar small densities in the region of the AP window, probably related to prior repair of a patent ductus. No consolidation. No visible pleural effusions or pneumothorax. No evidence of acute osseous abnormality. IMPRESSION: No evidence of acute cardiopulmonary disease. Electronically Signed   By: Margaretha Sheffield M.D.   On:  01/23/2022 16:24    Procedures Procedures    Medications Ordered in ED Medications  predniSONE (DELTASONE) tablet 40 mg (has no administration in time range)  albuterol (VENTOLIN HFA) 108 (90 Base) MCG/ACT inhaler 2 puff (has no administration in time range)    ED Course/ Medical Decision Making/ A&P                           Medical Decision Making WithPatient here for cough and shortness of breath.  Symptoms present x2 days.  Reports history of asthma although I do not find this in his medical record.  Has been out of his albuterol inhaler.  No reported fever or chills.  No history of DVT or PE.  On exam, patient well-appearing nontoxic.  Vital signs reassuring.  No tachycardia tachypnea or hypoxia.  He does have some diminished lung sounds bilaterally with expiratory wheeze heard at right lung base.  No increased work of breathing on my exam.  Symptoms likely related to asthma exacerbation versus URI.  ACS and PE also considered but felt less likely.  He is PERC negative and not having chest pain  Amount and/or Complexity of Data Reviewed Radiology: ordered.    Details: Chest x-ray without acute cardiopulmonary process Discussion of management or test interpretation with external provider(s): On recheck, lung sounds improved after albuterol.  He is ambulatory in the department without hypoxia. Feel that he is appropriate for discharge home, albuterol MDI dispensed for home use and prescription written for steroid.  He will follow-up with PCP.  Return precautions were discussed.  Risk Prescription drug management.           Final Clinical Impression(s) / ED Diagnoses Final diagnoses:  Bronchospasm    Rx / DC Orders ED Discharge Orders     None         Bufford Lope 01/23/22 1739    Luna Fuse, MD 02/05/22 [phone]

## 2022-03-06 DIAGNOSIS — R519 Headache, unspecified: Secondary | ICD-10-CM | POA: Insufficient documentation

## 2022-03-06 DIAGNOSIS — R0602 Shortness of breath: Secondary | ICD-10-CM | POA: Insufficient documentation

## 2022-03-06 DIAGNOSIS — Z20822 Contact with and (suspected) exposure to covid-19: Secondary | ICD-10-CM | POA: Insufficient documentation

## 2022-03-07 ENCOUNTER — Other Ambulatory Visit: Payer: Self-pay

## 2022-03-07 ENCOUNTER — Emergency Department (HOSPITAL_COMMUNITY)
Admission: EM | Admit: 2022-03-07 | Discharge: 2022-03-07 | Disposition: A | Discharge status: Home / Self Care | Payer: Self-pay | Attending: Emergency Medicine | Admitting: Emergency Medicine

## 2022-03-07 ENCOUNTER — Encounter (HOSPITAL_COMMUNITY): Payer: Self-pay | Admitting: Emergency Medicine

## 2022-03-07 DIAGNOSIS — R519 Headache, unspecified: Secondary | ICD-10-CM

## 2022-03-07 LAB — RESP PANEL BY RT-PCR (FLU A&B, COVID) ARPGX2
Influenza A by PCR: NEGATIVE
Influenza B by PCR: NEGATIVE
SARS Coronavirus 2 by RT PCR: NEGATIVE

## 2022-03-07 NOTE — ED Notes (Signed)
ED Provider at bedside. 

## 2022-03-07 NOTE — ED Triage Notes (Signed)
Pt c/o headache all day and states he is sob at times. Pt states he needs covid test to return to work.

## 2022-03-07 NOTE — ED Provider Notes (Signed)
[facility] EMERGENCY DEPARTMENT Provider Note   CSN: [id number] Arrival date & time: 03/06/22  2331     History  Chief Complaint  Patient presents with   Headache    Fitzgerald Dunne is a 26 y.o. male.  26 year old male who presents the ER today with intermittent shortness of breath and headache.  Patient concerned he has COVID.  His boss told me to get a COVID test returned to work and that is why is here.  Patient's been here for over 4 hours prior to my evaluation and patient states that his symptoms are improved.  He is no longer short of breath.  He states he did his inhaler which seemed to help.  States he did not take any rate headache at this slowly resolved and get back to a quiet room.  No fevers, cough, GI symptoms or other associated symptoms.   Headache      Home Medications Prior to Admission medications   Medication Sig Start Date End Date Taking? Authorizing Provider  albuterol (VENTOLIN HFA) 108 (90 Base) MCG/ACT inhaler Inhale 1-2 puffs into the lungs every 6 (six) hours as needed for wheezing or shortness of breath. 01/23/22   Triplett, Tammy, PA-C  aspirin 81 MG chewable tablet Chew 81 mg by mouth every 8 (eight) hours as needed. For pain     [provider]  cephALEXin (KEFLEX) 500 MG capsule Take 1 capsule (500 mg total) by mouth 4 (four) times daily. 11/24/21   Theron Arista, PA-C  doxycycline (VIBRAMYCIN) 100 MG capsule Take 1 capsule (100 mg total) by mouth 2 (two) times daily. One po bid x 7 days 06/06/20   Zadie Rhine, MD  omeprazole (PRILOSEC) 20 MG capsule Take 1 po BID x 2 weeks then once a day 08/21/12   Devoria Albe, MD  predniSONE (DELTASONE) 20 MG tablet Take 2 tablets (40 mg total) by mouth daily. 01/23/22   Triplett, Tammy, PA-C      Allergies    Patient has no known allergies.    Review of Systems   Review of Systems  Neurological:  Positive for headaches.    Physical Exam Updated Vital Signs BP (!) 148/99 (BP Location: Right Arm)    Pulse 63   Temp 97.9 F (36.6 C) (Oral)   Resp 18   Ht 5\' 8"  (1.727 m)   Wt 90.7 kg   SpO2 100%   BMI 30.41 kg/m  Physical Exam Vitals and nursing note reviewed.  Constitutional:      Appearance: He is well-developed.  HENT:     Head: Normocephalic and atraumatic.  Cardiovascular:     Rate and Rhythm: Normal rate.  Pulmonary:     Effort: Pulmonary effort is normal. No respiratory distress.  Abdominal:     General: There is no distension.  Musculoskeletal:        General: Normal range of motion.     Cervical back: Normal range of motion.  Neurological:     Mental Status: He is alert.     Comments: No altered mental status, able to give full seemingly accurate history.  Face is symmetric, EOM's intact, pupils equal and reactive, vision intact, tongue and uvula midline without deviation. Upper and Lower extremity motor 5/5, intact pain perception in distal extremities, 2+ reflexes in biceps, patella and achilles tendons. Able to perform finger to nose normal with both hands. Walks without assistance or evident ataxia.       ED Results / Procedures / Treatments  Labs (all labs ordered are listed, but only abnormal results are displayed) Labs Reviewed  RESP PANEL BY RT-PCR (FLU A&B, COVID) [id number]    EKG None  Radiology No results found.  Procedures Procedures    Medications Ordered in ED Medications - No data to display  ED Course/ Medical Decision Making/ A&P                           Medical Decision Making  26 yo M here with headache and dyspnea. Symptoms mostly resolved.  Not Severe, head trauma, new or other concerning symptoms.  Lungs are clear.  COVID-negative.  Can return to work.   Final Clinical Impression(s) / ED Diagnoses Final diagnoses:  None    Rx / DC Orders ED Discharge Orders     None         Drakkar Medeiros, Barbara Cower, MD 03/07/22 0403

## 2022-03-12 DIAGNOSIS — J4521 Mild intermittent asthma with (acute) exacerbation: Secondary | ICD-10-CM | POA: Insufficient documentation

## 2022-03-12 DIAGNOSIS — R000 Tachycardia, unspecified: Secondary | ICD-10-CM | POA: Insufficient documentation

## 2022-03-13 ENCOUNTER — Other Ambulatory Visit: Payer: Self-pay

## 2022-03-13 ENCOUNTER — Encounter (HOSPITAL_COMMUNITY): Payer: Self-pay | Admitting: Emergency Medicine

## 2022-03-13 ENCOUNTER — Emergency Department (HOSPITAL_COMMUNITY)
Admission: EM | Admit: 2022-03-13 | Discharge: 2022-03-13 | Disposition: A | Discharge status: Home / Self Care | Payer: Self-pay | Attending: Emergency Medicine | Admitting: Emergency Medicine

## 2022-03-13 DIAGNOSIS — J4521 Mild intermittent asthma with (acute) exacerbation: Secondary | ICD-10-CM

## 2022-03-13 MED ORDER — ALBUTEROL SULFATE HFA 108 (90 BASE) MCG/ACT IN AERS
2.0000 | INHALATION_SPRAY | Freq: Once | RESPIRATORY_TRACT | Status: AC
  Administered 2022-03-13: 2 via RESPIRATORY_TRACT
  Filled 2022-03-13: qty 6.7

## 2022-03-13 MED ORDER — DEXAMETHASONE 4 MG PO TABS
10.0000 mg | ORAL_TABLET | Freq: Once | ORAL | Status: AC
  Administered 2022-03-13: 10 mg via ORAL
  Filled 2022-03-13: qty 3

## 2022-03-13 NOTE — ED Triage Notes (Signed)
Pt c/o sob and states he is out of albuterol inhaler and needs another one.

## 2022-03-13 NOTE — Discharge Instructions (Signed)
Use your inhaler every 4-6 hours as needed.  Make sure to avoid secondhand smoke.

## 2022-03-13 NOTE — ED Provider Notes (Signed)
[facility] EMERGENCY DEPARTMENT Provider Note   CSN: [id number] Arrival date & time: 03/12/22  2352     History  Chief Complaint  Patient presents with   Shortness of Breath    Timothy Harris is a 26 y.o. male.  HPI     This is a 26 year old male with a history of asthma who presents with shortness of breath and cough.  Patient reports that he was at a concert a hookah lounge when he began to have significant cough and shortness of breath.  He went to use his albuterol inhaler and noted that he was out.  He states that he gets a mild exacerbation every 1 to 2 months.  He has not needed to be hospitalized.  Denies any recent fevers.  Denies productivity of cough.  Home Medications Prior to Admission medications   Medication Sig Start Date End Date Taking? Authorizing Provider  albuterol (VENTOLIN HFA) 108 (90 Base) MCG/ACT inhaler Inhale 1-2 puffs into the lungs every 6 (six) hours as needed for wheezing or shortness of breath. 01/23/22   Triplett, Tammy, PA-C  aspirin 81 MG chewable tablet Chew 81 mg by mouth every 8 (eight) hours as needed. For pain     [provider]  cephALEXin (KEFLEX) 500 MG capsule Take 1 capsule (500 mg total) by mouth 4 (four) times daily. 11/24/21   Theron Arista, PA-C  doxycycline (VIBRAMYCIN) 100 MG capsule Take 1 capsule (100 mg total) by mouth 2 (two) times daily. One po bid x 7 days 06/06/20   Zadie Rhine, MD  omeprazole (PRILOSEC) 20 MG capsule Take 1 po BID x 2 weeks then once a day 08/21/12   Devoria Albe, MD  predniSONE (DELTASONE) 20 MG tablet Take 2 tablets (40 mg total) by mouth daily. 01/23/22   Triplett, Tammy, PA-C      Allergies    Patient has no known allergies.    Review of Systems   Review of Systems  Constitutional:  Negative for fever.  Respiratory:  Positive for cough and shortness of breath.   All other systems reviewed and are negative.   Physical Exam Updated Vital Signs BP 128/78   Pulse (!) 109   Temp 98.4 F (36.9  C) (Oral)   Resp 18   Ht 1.727 m (5\' 8" )   Wt 90.7 kg   SpO2 99%   BMI 30.41 kg/m  Physical Exam Vitals and nursing note reviewed.  Constitutional:      Appearance: He is well-developed.  HENT:     Head: Normocephalic and atraumatic.  Eyes:     Pupils: Pupils are equal, round, and reactive to light.  Cardiovascular:     Rate and Rhythm: Normal rate and regular rhythm.     Heart sounds: Normal heart sounds. No murmur heard. Pulmonary:     Effort: Pulmonary effort is normal. No respiratory distress.     Breath sounds: Wheezing present.     Comments: No respiratory distress, fair air movement, occasional wheeze Abdominal:     General: Bowel sounds are normal.     Palpations: Abdomen is soft.     Tenderness: There is no abdominal tenderness. There is no rebound.  Musculoskeletal:     Cervical back: Neck supple.  Lymphadenopathy:     Cervical: No cervical adenopathy.  Skin:    General: Skin is warm and dry.  Neurological:     Mental Status: He is alert and oriented to person, place, and time.  Psychiatric:  Mood and Affect: Mood normal.     ED Results / Procedures / Treatments   Labs (all labs ordered are listed, but only abnormal results are displayed) Labs Reviewed - No data to display  EKG None  Radiology No results found.  Procedures Procedures    Medications Ordered in ED Medications  albuterol (VENTOLIN HFA) 108 (90 Base) MCG/ACT inhaler 2 puff (2 puffs Inhalation Given 03/13/22 0124)  dexamethasone (DECADRON) tablet 10 mg (10 mg Oral Given 03/13/22 0123)    ED Course/ Medical Decision Making/ A&P                           Medical Decision Making Risk Prescription drug management.   This patient presents to the ED for concern of cough, shortness of, this involves an extensive number of treatment options, and is a complaint that carries with it a high risk of complications and morbidity.  I considered the following differential and admission  for this acute, potentially life threatening condition.  The differential diagnosis includes asthma exacerbation, bronchitis, URI, pneumonia  MDM:    This is a 26 year old male with a history of asthma who presents with cough and shortness of breath.  He is nontoxic and vital signs are reassuring.  He is in no respiratory distress.  He has a very occasional wheeze.  He was given an inhaler and a dose of Decadron.  On recheck, he states he feels much better.  Will discharge home with an inhaler.  Suspect mild intermittent asthma exacerbation.  Lower suspicion for pneumonia.  X-ray imaging deferred.  (Labs, imaging, consults)  Labs: I Ordered, and personally interpreted labs.  The pertinent results include: None  Imaging Studies ordered: I ordered imaging studies including none I independently visualized and interpreted imaging. I agree with the radiologist interpretation  Additional history obtained from chart review.  External records from outside source obtained and reviewed including prior evaluations  Cardiac Monitoring: The patient was maintained on a cardiac monitor.  I personally viewed and interpreted the cardiac monitored which showed an underlying rhythm of: Sinus tachycardia  Reevaluation: After the interventions noted above, I reevaluated the patient and found that they have :improved  Social Determinants of Health: Lives independently  Disposition: Discharge  Co morbidities that complicate the patient evaluation  Past Medical History:  Diagnosis Date   Syphilis      Medicines Meds ordered this encounter  Medications   albuterol (VENTOLIN HFA) 108 (90 Base) MCG/ACT inhaler 2 puff   dexamethasone (DECADRON) tablet 10 mg    I have reviewed the patients home medicines and have made adjustments as needed  Problem List / ED Course: Problem List Items Addressed This Visit   None Visit Diagnoses     Mild intermittent asthma with acute exacerbation    -  Primary    Relevant Medications   albuterol (VENTOLIN HFA) 108 (90 Base) MCG/ACT inhaler 2 puff (Completed)   dexamethasone (DECADRON) tablet 10 mg (Completed)                   Final Clinical Impression(s) / ED Diagnoses Final diagnoses:  Mild intermittent asthma with acute exacerbation    Rx / DC Orders ED Discharge Orders     None         Shon Baton, MD 03/13/22 [phone]

## 2023-11-26 IMAGING — DX DG CHEST 2V
2 series · 2 of 2 positions shown · non-contrast
Comparison: Chest radiographs November 14, 2021.

CLINICAL DATA: SOB

EXAM:
CHEST - 2 VIEW

[chest pa]
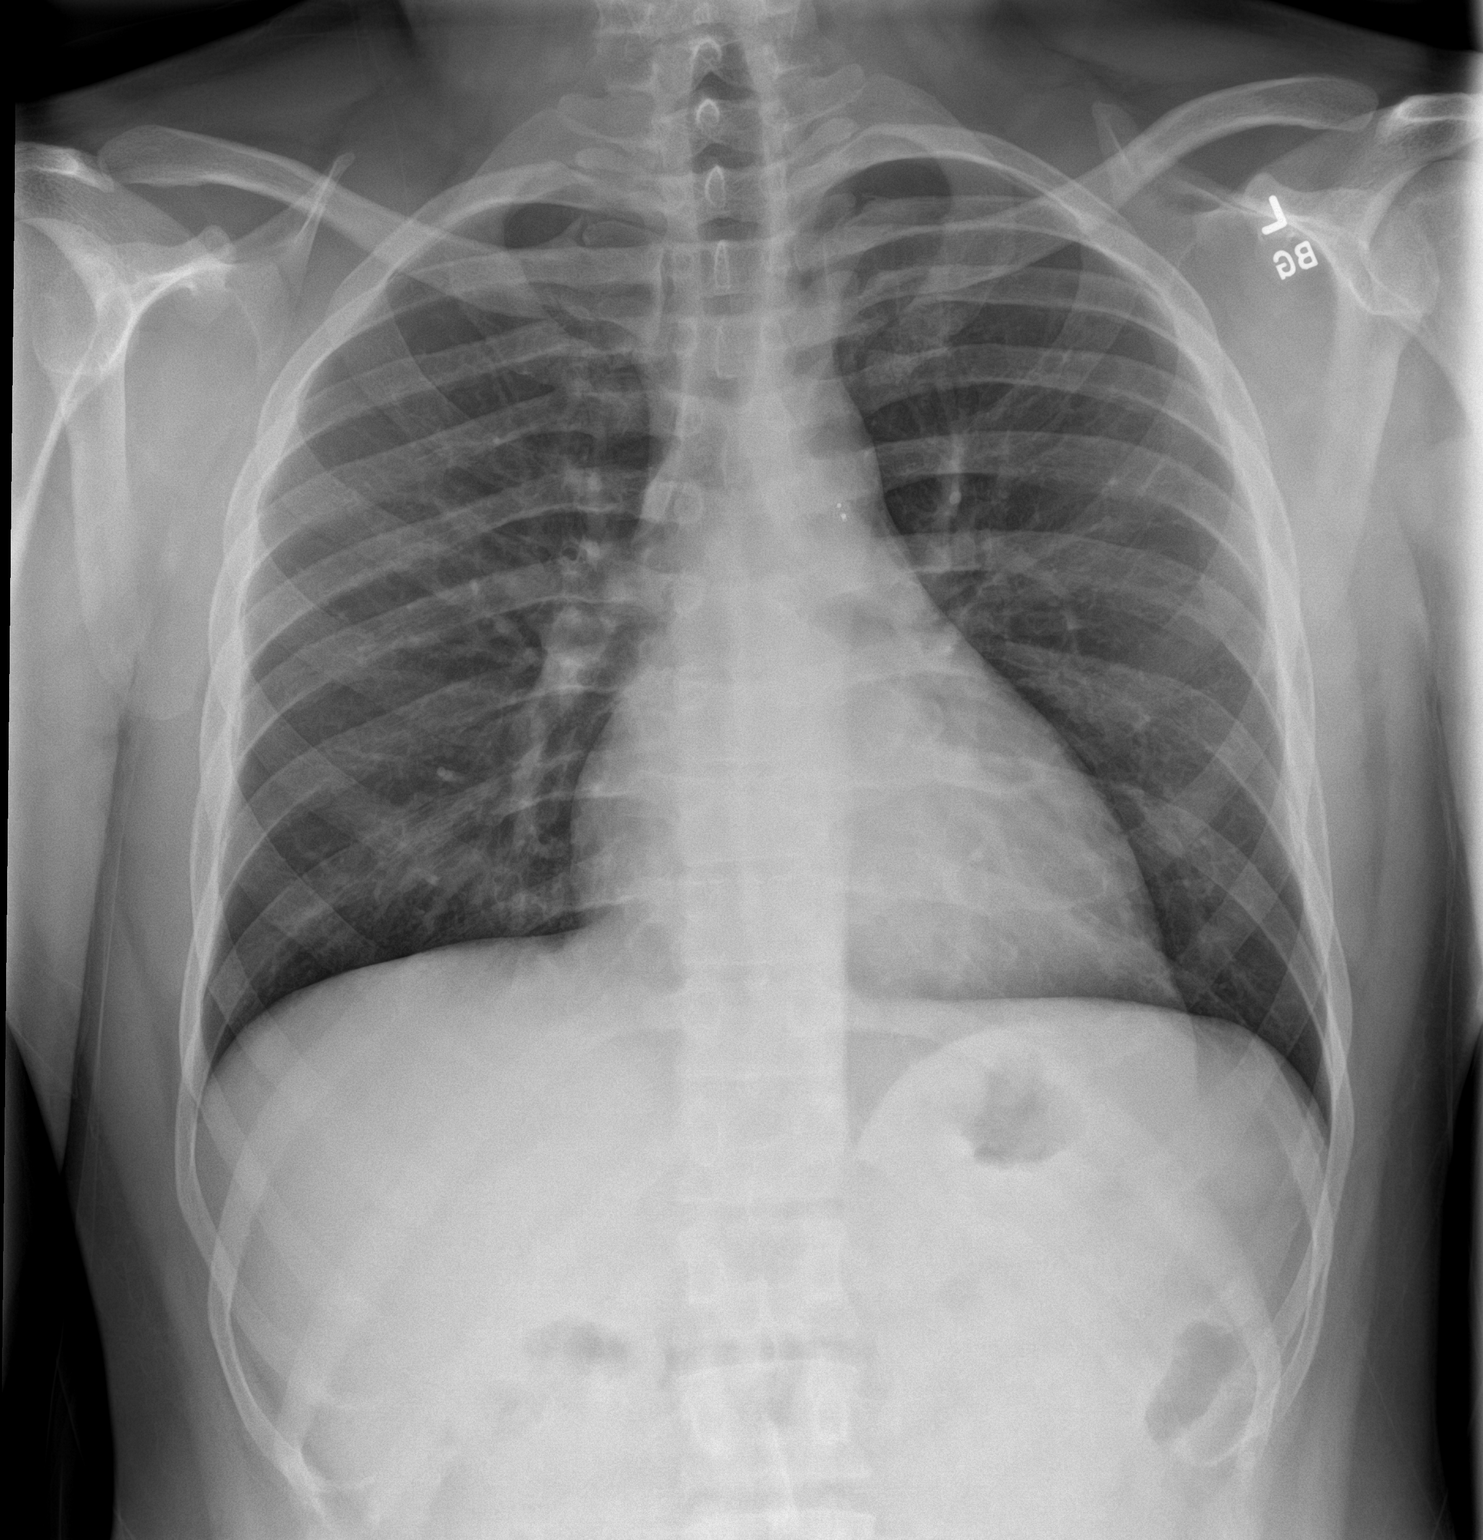

[chest lat]
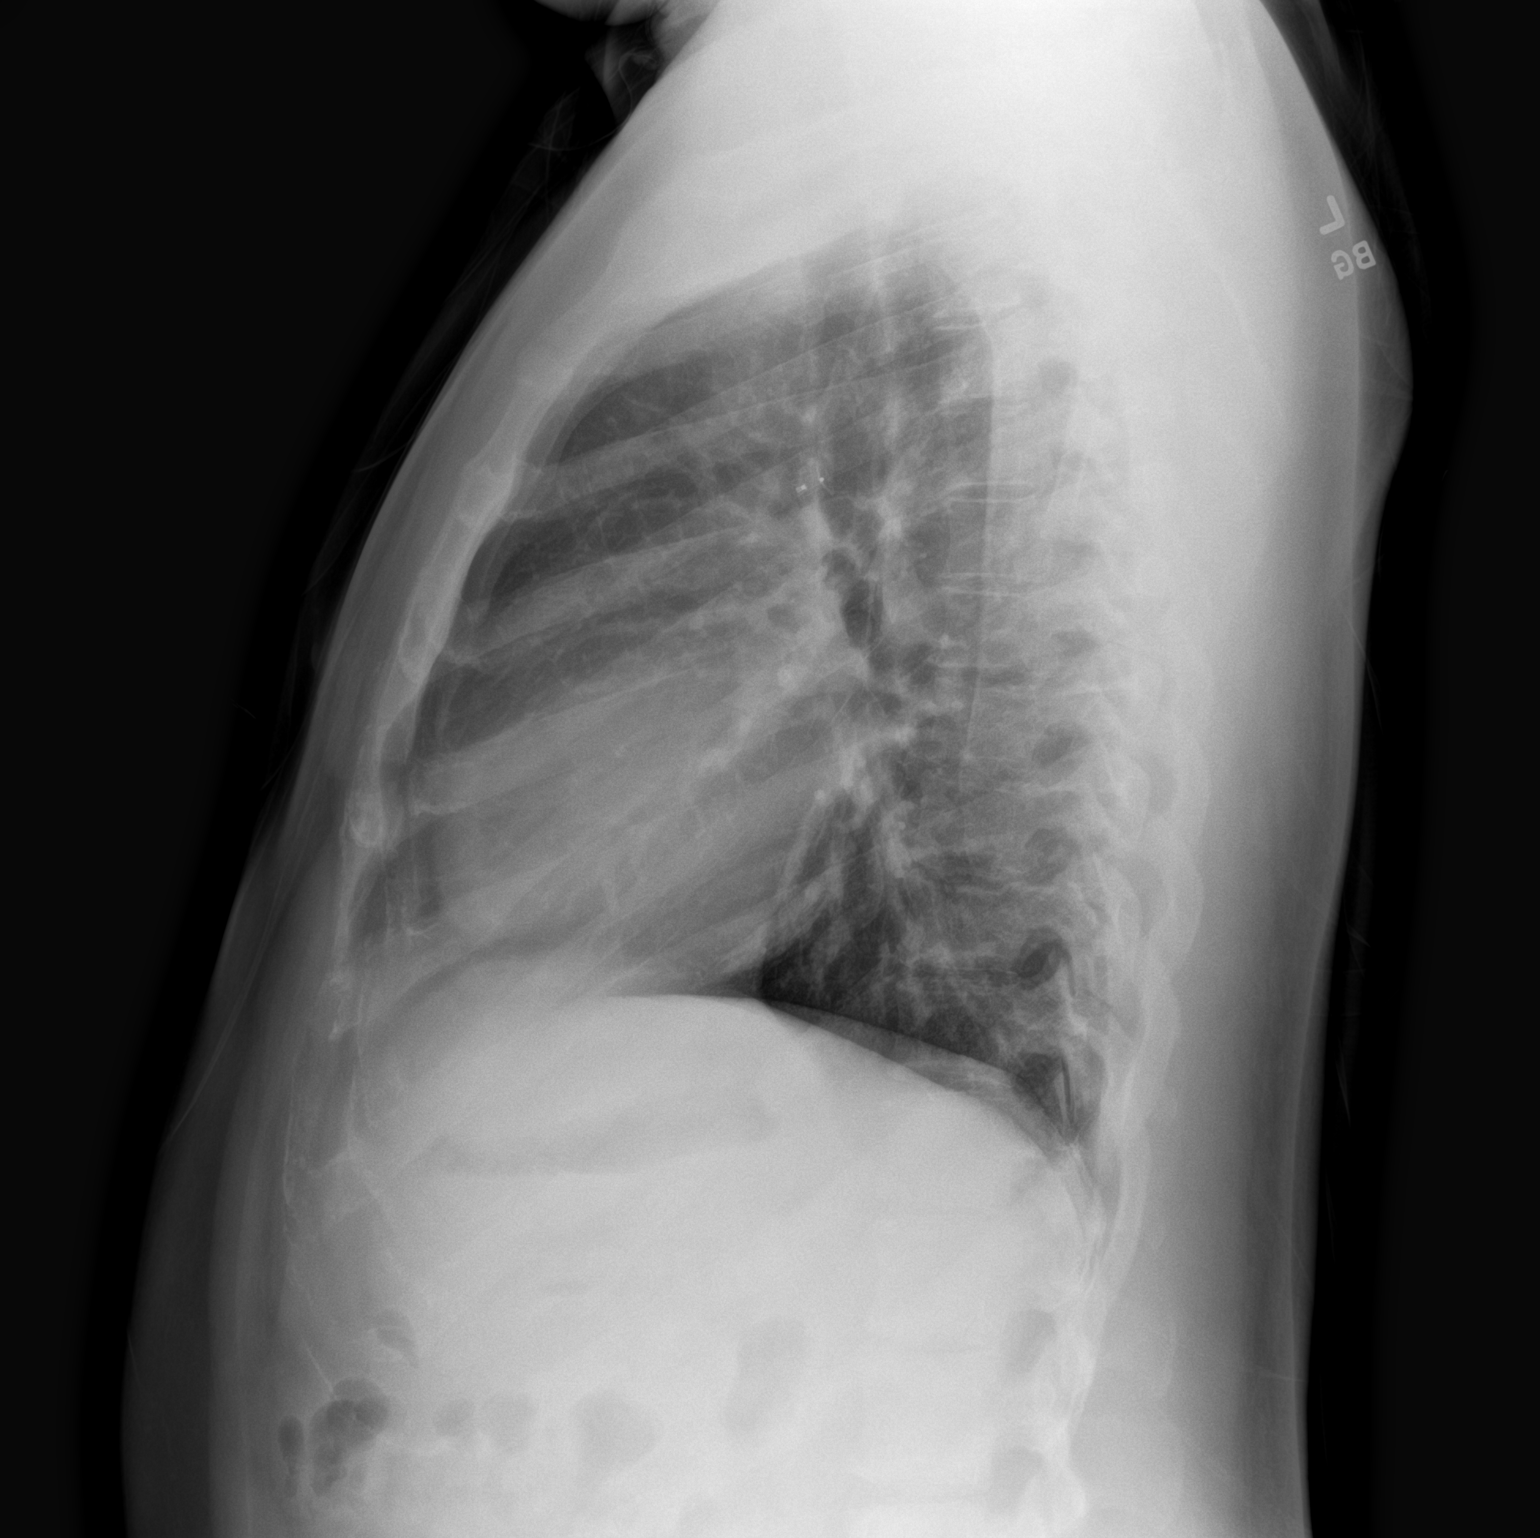

[2 of 2 positions shown; findings below may reference images not displayed]

FINDINGS: Similar cardiomediastinal silhouette. Similar small densities in the
region of the AP window, probably related to prior repair of a
patent ductus. No consolidation. No visible pleural effusions or
pneumothorax. No evidence of acute osseous abnormality.
IMPRESSION: No evidence of acute cardiopulmonary disease.
